# Patient Record
Sex: Male | Born: 2000 | Race: White | Hispanic: No | Marital: Single | Smoking: Never smoker
Health system: Southern US, Community
[De-identification: ages and names within clinical notes are randomized; demographics above are authoritative.]

## PROBLEM LIST (undated history)

## (undated) DIAGNOSIS — J302 Other seasonal allergic rhinitis: Secondary | ICD-10-CM

## (undated) DIAGNOSIS — S060XAA Concussion with loss of consciousness status unknown, initial encounter: Secondary | ICD-10-CM

## (undated) HISTORY — PX: MYRINGOTOMY WITH TUBE PLACEMENT: SHX5663

---
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
## 2022-04-21 ENCOUNTER — Emergency Department (HOSPITAL_COMMUNITY): Payer: No Typology Code available for payment source

## 2022-04-21 ENCOUNTER — Other Ambulatory Visit: Payer: Self-pay

## 2022-04-21 ENCOUNTER — Encounter (HOSPITAL_COMMUNITY): Payer: Self-pay

## 2022-04-21 ENCOUNTER — Emergency Department (HOSPITAL_COMMUNITY)
Admission: EM | Admit: 2022-04-21 | Discharge: 2022-04-21 | Disposition: A | Discharge status: Home / Self Care | Payer: No Typology Code available for payment source | Attending: Emergency Medicine | Admitting: Emergency Medicine

## 2022-04-21 DIAGNOSIS — S060XAA Concussion with loss of consciousness status unknown, initial encounter: Secondary | ICD-10-CM | POA: Insufficient documentation

## 2022-04-21 DIAGNOSIS — M25572 Pain in left ankle and joints of left foot: Secondary | ICD-10-CM | POA: Insufficient documentation

## 2022-04-21 DIAGNOSIS — W1789XA Other fall from one level to another, initial encounter: Secondary | ICD-10-CM | POA: Diagnosis not present

## 2022-04-21 DIAGNOSIS — M25561 Pain in right knee: Secondary | ICD-10-CM | POA: Insufficient documentation

## 2022-04-21 DIAGNOSIS — S60221A Contusion of right hand, initial encounter: Secondary | ICD-10-CM | POA: Diagnosis not present

## 2022-04-21 DIAGNOSIS — S0990XA Unspecified injury of head, initial encounter: Secondary | ICD-10-CM | POA: Diagnosis present

## 2022-04-21 DIAGNOSIS — Y990 Civilian activity done for income or pay: Secondary | ICD-10-CM | POA: Diagnosis not present

## 2022-04-21 DIAGNOSIS — S8011XA Contusion of right lower leg, initial encounter: Secondary | ICD-10-CM | POA: Insufficient documentation

## 2022-04-21 DIAGNOSIS — S8012XA Contusion of left lower leg, initial encounter: Secondary | ICD-10-CM | POA: Insufficient documentation

## 2022-04-21 HISTORY — DX: Concussion with loss of consciousness status unknown, initial encounter: S06.0XAA

## 2022-04-21 MED ORDER — MORPHINE SULFATE (PF) 2 MG/ML IV SOLN
2.0000 mg | Freq: Once | INTRAVENOUS | Status: AC
  Administered 2022-04-21: 2 mg via INTRAVENOUS
  Filled 2022-04-21: qty 1

## 2022-04-21 MED ORDER — LIDOCAINE-EPINEPHRINE (PF) 2 %-1:200000 IJ SOLN
20.0000 mL | Freq: Once | INTRAMUSCULAR | Status: AC
  Administered 2022-04-21: 20 mL
  Filled 2022-04-21: qty 20

## 2022-04-21 NOTE — ED Notes (Signed)
Suture cart placed at bedside. Lidocaine administered by MD Long, staples placed.

## 2022-04-21 NOTE — ED Provider Notes (Incomplete)
[facility] EMERGENCY DEPARTMENT Provider Note   CSN: [id number] Arrival date & time: 04/21/22  1650     History  Chief Complaint  Patient presents with   Knee Pain   Head Injury    Fernando Shah is a 21 y.o. male.  21 year old male with no pertinent past medical history presents to the ED as an unleveled trauma after he was struck by multiple falling cinderblocks approximate 1 hour ago.  Patient was not wearing a hard hat or helmet when a forklift knocked over several cinderblocks which landed on his head.  He denies losing consciousness and only complains of left ankle, right knee pain, and a headache.  Tetanus is up-to-date.  Denies neck or back pain at this time.  The history is provided by the patient and a parent.       Home Medications Prior to Admission medications   Medication Sig Start Date End Date Taking? Authorizing Provider  cetirizine (ZYRTEC) 10 MG tablet Take 10 mg by mouth daily as needed for allergies.   Yes [provider]  fluticasone (FLONASE) 50 MCG/ACT nasal spray Place 1 spray into both nostrils daily as needed for allergies.   Yes [provider]      Allergies    Augmentin [amoxicillin-pot clavulanate]    Review of Systems   Review of Systems  Cardiovascular:  Negative for chest pain.  Gastrointestinal:  Negative for abdominal pain and nausea.  Musculoskeletal:  Positive for arthralgias. Negative for back pain and neck pain.  Neurological:  Positive for headaches. Negative for weakness and numbness.    Physical Exam Updated Vital Signs BP 128/83   Pulse 84   Temp 98.6 F (37 C) (Oral)   Resp 18   Ht 5\' 11"  (1.803 m)   Wt 63.5 kg   SpO2 97%   BMI 19.53 kg/m  Physical Exam Vitals and nursing note reviewed.  Constitutional:      General: He is not in acute distress.    Appearance: Normal appearance. He is well-developed. He is obese. He is not ill-appearing.     Interventions: Cervical collar in  place.  HENT:     Head: Normocephalic.     Comments: 4 cm triangular shaped laceration present to the occipital scalp, currently hemostatic. No palpable step offs or deformities. Left periorbital ecchymosis with full active EOM without signs of entrapment. Midface stable to palpation. Eyes:     Extraocular Movements: Extraocular movements intact.     Conjunctiva/sclera: Conjunctivae normal.  Cardiovascular:     Rate and Rhythm: Normal rate and regular rhythm.     Pulses:          Radial pulses are 2+ on the right side and 2+ on the left side.       Dorsalis pedis pulses are 2+ on the right side and 2+ on the left side.     Heart sounds: No murmur heard. Pulmonary:     Effort: Pulmonary effort is normal. No respiratory distress.     Breath sounds: Normal breath sounds.  Chest:     Comments: Chest wall stable and nontender to AP and lateral compression Abdominal:     Palpations: Abdomen is soft.     Tenderness: There is no abdominal tenderness. There is no guarding.  Musculoskeletal:     Comments: No midline cervical, thoracic, or lumbar spinal tenderness to palpation.  No step-offs or deformities.  At flexion and extension at the bilateral shoulders, elbows, and wrists.  Bilateral upper extremities and lower extremities are neurovascularly intact.  No bony tenderness to palpation of the bilateral upper or lower extremities, specifically over the left malleoli or the carpals/metacarpals of the right hand.  No anatomic snuffbox tenderness.  Skin:    General: Skin is warm and dry.     Capillary Refill: Capillary refill takes less than 2 seconds.     Comments: Scattered abrasions and ecchymosis present to the bilateral lower extremities.  There is a superficial linear abrasion hematoma present to the dorsal aspect of the right hand, overlying the MCP joint of the right thumb.  Neurological:     Mental Status: He is alert.     GCS: GCS eye subscore is 4. GCS verbal subscore is 5. GCS motor  subscore is 6.     Comments: Moving all 4 extremities spontaneously     ED Results / Procedures / Treatments   Labs (all labs ordered are listed, but only abnormal results are displayed) Labs Reviewed - No data to display  EKG None  Radiology DG Ankle Complete Left  Result Date: 04/21/2022 CLINICAL DATA:  Left ankle pain. EXAM: LEFT ANKLE COMPLETE - 3+ VIEW COMPARISON:  None Available. FINDINGS: There is a tiny bone fragment adjacent to the tip of the medial malleolus suspicious for a small avulsion fracture. No other acute fracture. There is no dislocation. The bones are well mineralized. No arthritic changes. There is minimal soft tissue swelling over the medial ankle. No radiopaque foreign object or soft tissue gas. IMPRESSION: Age indeterminate, likely acute, tiny avulsion fracture of the tip of the medial malleolus. Correlation with clinical exam and point tenderness recommended. Electronically Signed   By: Elgie Collard M.D.   On: 04/21/2022 20:00   CT Head Wo Contrast  Result Date: 04/21/2022 CLINICAL DATA:  Concrete pallets fell approximally 12 feet on top of the patient. EXAM: CT HEAD WITHOUT CONTRAST CT CERVICAL SPINE WITHOUT CONTRAST TECHNIQUE: Multidetector CT imaging of the head and cervical spine was performed following the standard protocol without intravenous contrast. Multiplanar CT image reconstructions of the cervical spine were also generated. RADIATION DOSE REDUCTION: This exam was performed according to the departmental dose-optimization program which includes automated exposure control, adjustment of the mA and/or kV according to patient size and/or use of iterative reconstruction technique. COMPARISON:  CT head and cervical spine dated February 18, 2016. FINDINGS: CT HEAD FINDINGS Brain: No evidence of acute infarction, hemorrhage, hydrocephalus, extra-axial collection or mass lesion/mass effect. Vascular: No hyperdense vessel or unexpected calcification. Skull: Normal.  Negative for fracture or focal lesion. Sinuses/Orbits: No acute finding. Other: None. CT CERVICAL SPINE FINDINGS Alignment: Straightening of the normal cervical lordosis. No traumatic malalignment. Skull base and vertebrae: No acute fracture. No primary bone lesion or focal pathologic process. Soft tissues and spinal canal: No prevertebral fluid or swelling. No visible canal hematoma. Disc levels:  Normal. Upper chest: Negative. Other: None. IMPRESSION: 1. No acute intracranial abnormality. 2. No acute cervical spine fracture or traumatic listhesis. Electronically Signed   By: Obie Dredge M.D.   On: 04/21/2022 18:52   CT Cervical Spine Wo Contrast  Result Date: 04/21/2022 CLINICAL DATA:  Concrete pallets fell approximally 12 feet on top of the patient. EXAM: CT HEAD WITHOUT CONTRAST CT CERVICAL SPINE WITHOUT CONTRAST TECHNIQUE: Multidetector CT imaging of the head and cervical spine was performed following the standard protocol without intravenous contrast. Multiplanar CT image reconstructions of the cervical spine were also generated. RADIATION DOSE REDUCTION: This exam was performed  according to the departmental dose-optimization program which includes automated exposure control, adjustment of the mA and/or kV according to patient size and/or use of iterative reconstruction technique. COMPARISON:  CT head and cervical spine dated February 18, 2016. FINDINGS: CT HEAD FINDINGS Brain: No evidence of acute infarction, hemorrhage, hydrocephalus, extra-axial collection or mass lesion/mass effect. Vascular: No hyperdense vessel or unexpected calcification. Skull: Normal. Negative for fracture or focal lesion. Sinuses/Orbits: No acute finding. Other: None. CT CERVICAL SPINE FINDINGS Alignment: Straightening of the normal cervical lordosis. No traumatic malalignment. Skull base and vertebrae: No acute fracture. No primary bone lesion or focal pathologic process. Soft tissues and spinal canal: No prevertebral fluid or  swelling. No visible canal hematoma. Disc levels:  Normal. Upper chest: Negative. Other: None. IMPRESSION: 1. No acute intracranial abnormality. 2. No acute cervical spine fracture or traumatic listhesis. Electronically Signed   By: Obie Dredge M.D.   On: 04/21/2022 18:52    Procedures Procedures    Medications Ordered in ED Medications  morphine (PF) 2 MG/ML injection 2 mg (2 mg Intravenous Given 04/21/22 1857)  lidocaine-EPINEPHrine (XYLOCAINE W/EPI) 2 %-1:200000 (PF) injection 20 mL (20 mLs Infiltration Given by Other 04/21/22 1938)    ED Course/ Medical Decision Making/ A&P                           Medical Decision Making Amount and/or Complexity of Data Reviewed Independent Historian: parent Radiology: ordered.  Risk Prescription drug management. Parenteral controlled substances.   21 year old previously healthy male presents the ED after multiple cinderblocks fell on his head.  On arrival, patient is hemodynamically stable, GCS of 15, moving all 4 extremity spontaneously.  Initial concern for underlying skull fractures with possible ICH. Considered cervical spine fractures due to the axial load of the injury, though he his spine is non-tender on exam.   Final Clinical Impression(s) / ED Diagnoses Final diagnoses:  Injury of head, initial encounter  Concussion with unknown loss of consciousness status, initial encounter    Rx / DC Orders ED Discharge Orders     None

## 2022-04-21 NOTE — ED Triage Notes (Signed)
Arrived via EMS, Pt underneath concrete pallets that fell approximately 10-14 feet on top of his body. No LOC, No Dizziness denies being on any blood thinner. C/O left ankle pain and right ankle pain , multiple abrasions to extremities with lacerations to occipital area.

## 2022-04-21 NOTE — ED Notes (Signed)
Mr. Hair return from CT. Denies the need for pain medications. States just sleepy and wants to go to bed at this time.

## 2022-04-21 NOTE — Discharge Instructions (Signed)
Your CT scans and xrays did not show any injuries. You likely have a concussion. Please limit screen time and ensure adequate hydration and good sleep hygiene. If you develop any worsening headache, numbness or weakness in your arms and legs, please return to the emergency room. Staples will need to be removed in 7 days. Thank you for letting us care for you.

## 2023-04-11 IMAGING — CT CT HEAD W/O CM
4 series · 16 of 47 positions shown, 18 images · non-contrast
Comparison: CT head and cervical spine dated February 18, 2016.

CLINICAL DATA: Concrete pallets fell approximally 12 feet on top of
the patient.



[Series 3: head without · axial · non-contrast · 0.43mm/px · z∈[-180,-60]mm · 7 of 33 slices shown, 9 images]
[im 5/33  brain]
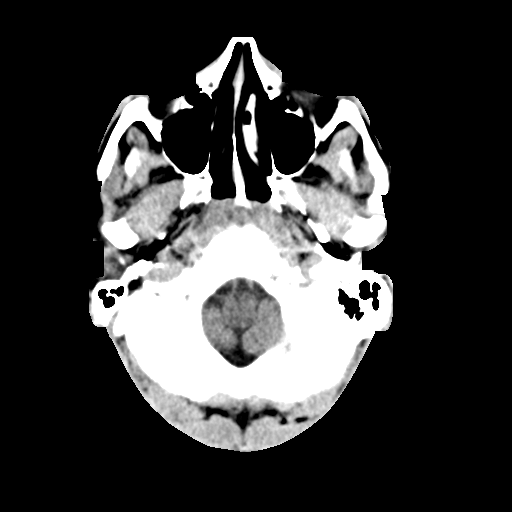
[im 5/33  bone]
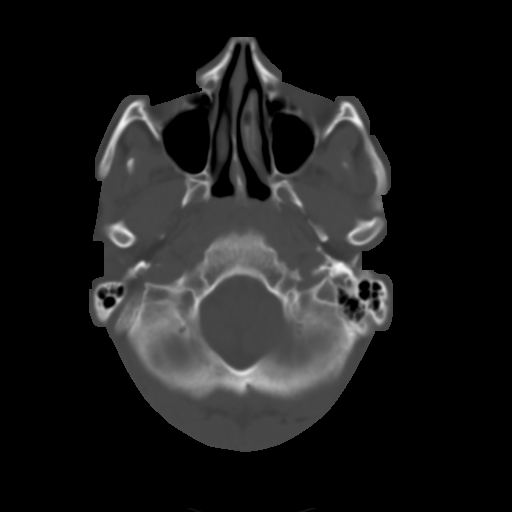
[im 9/33  brain]
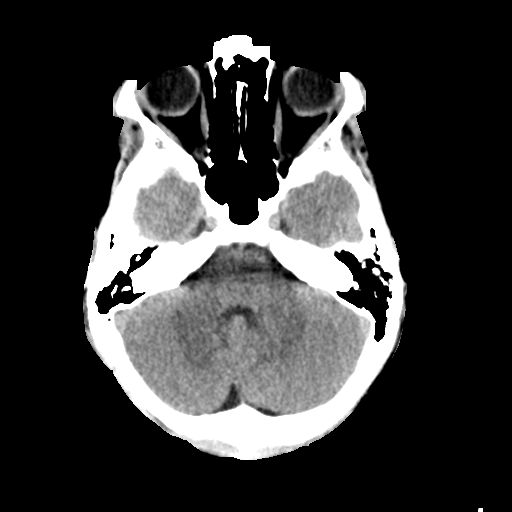
[im 13/33  brain]
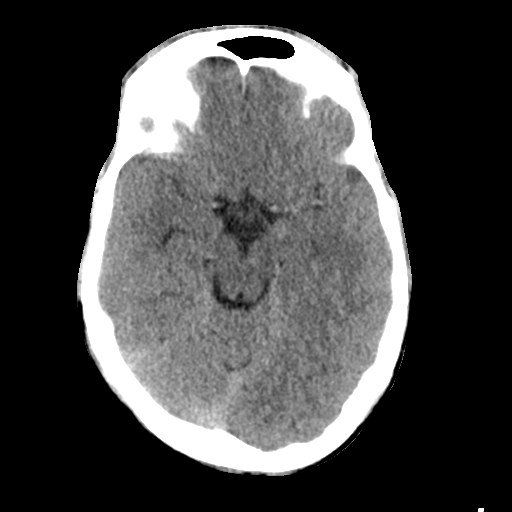
[im 17/33  brain]
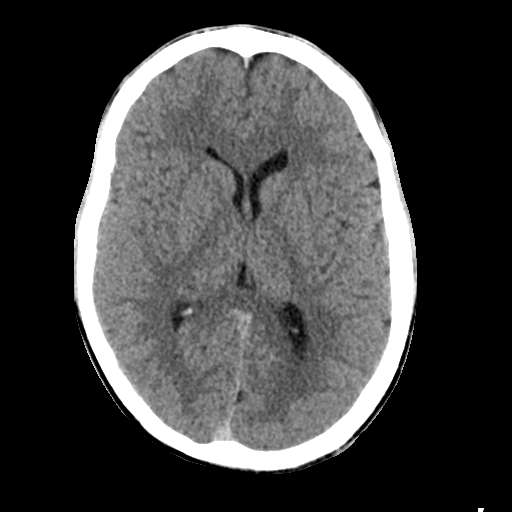
[im 21/33  brain]
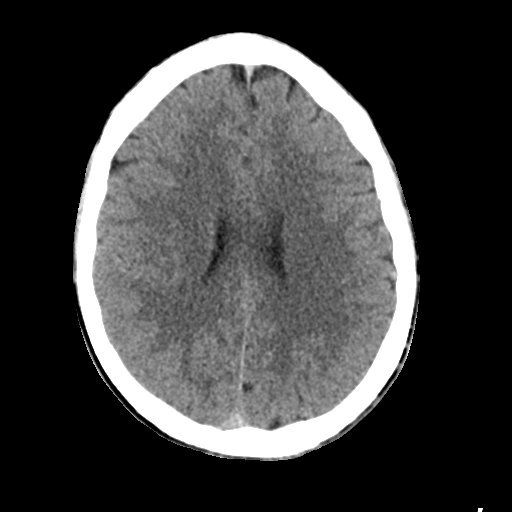
[im 21/33  bone]
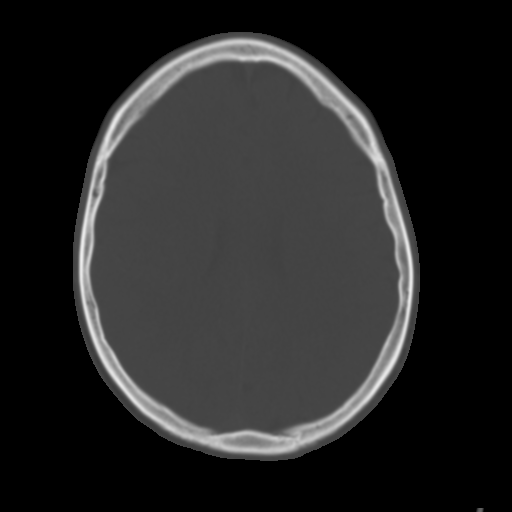
[im 25/33  brain]
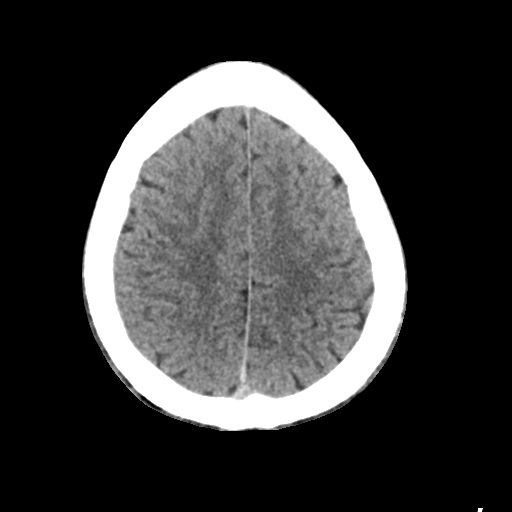
[im 29/33  brain]
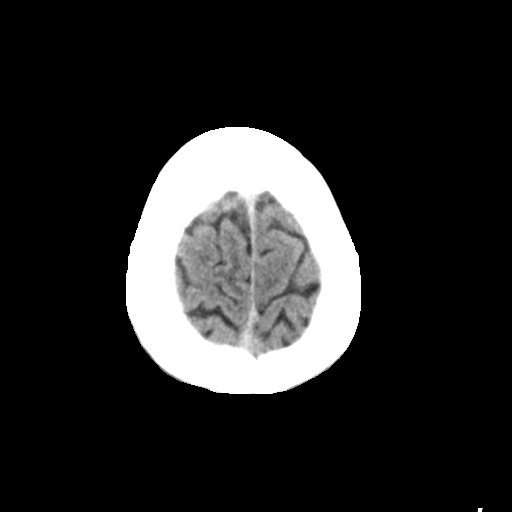

[Series 4: head bone · axial · 0.43mm/px · z∈[-184,-152]mm · 3 of 83 slices shown]
[im 9/83  bone]
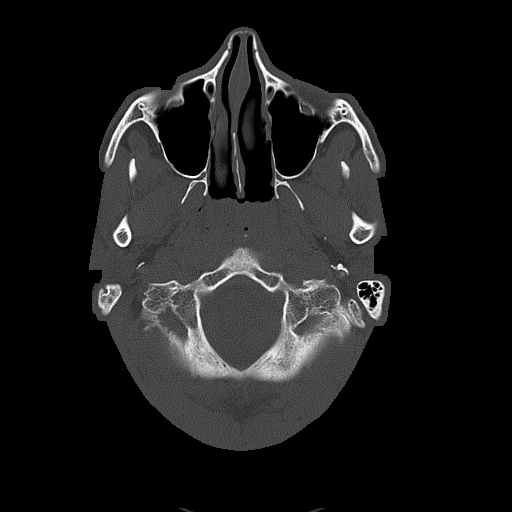
[im 17/83  bone]
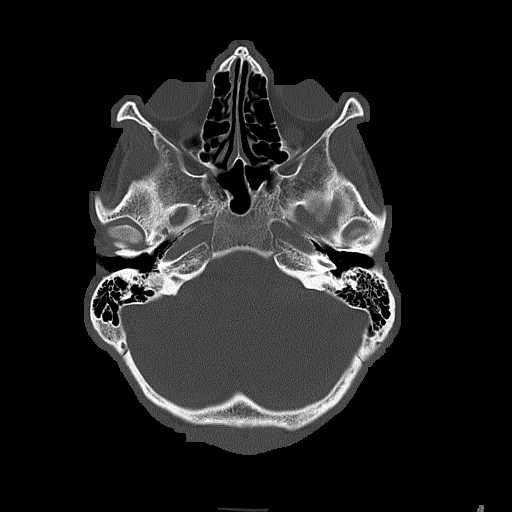
[im 25/83  bone]
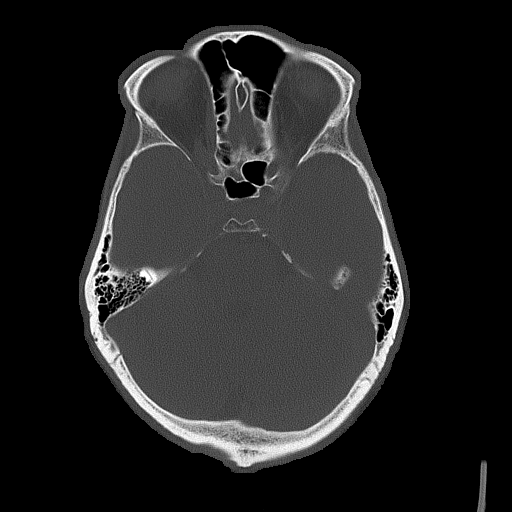

[Series 5: head without cor · coronal · non-contrast · 0.31mm/px · 3 of 71 slices shown]
[im 24/71  brain]
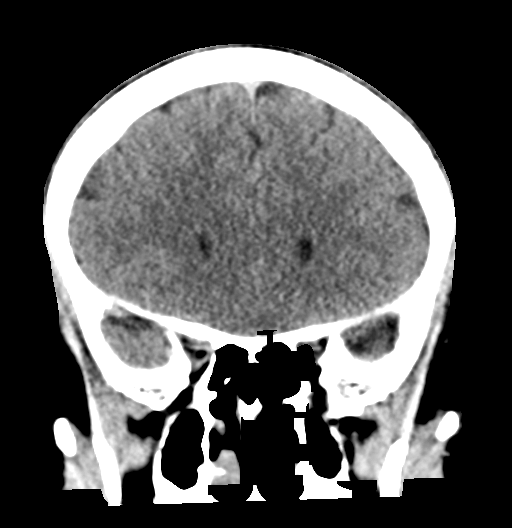
[im 32/71  brain]
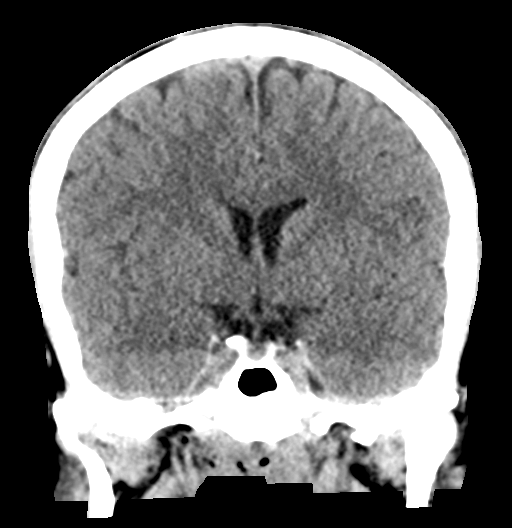
[im 39/71  brain]
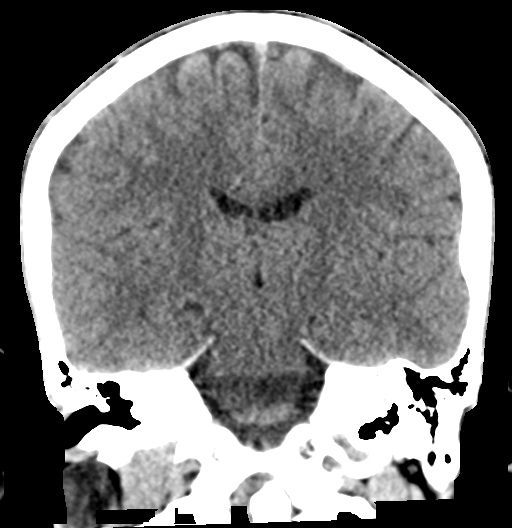

[Series 6: head without sag · sagittal · non-contrast · 0.32mm/px · 3 of 55 slices shown]
[im 19/55  brain]
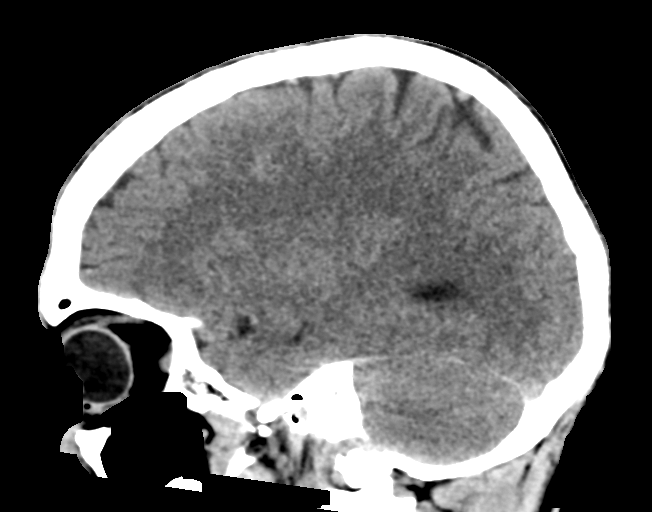
[im 28/55  brain]
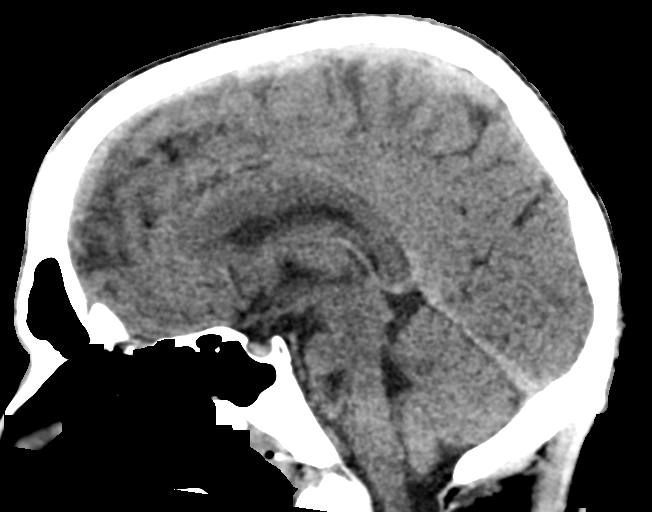
[im 37/55  brain]
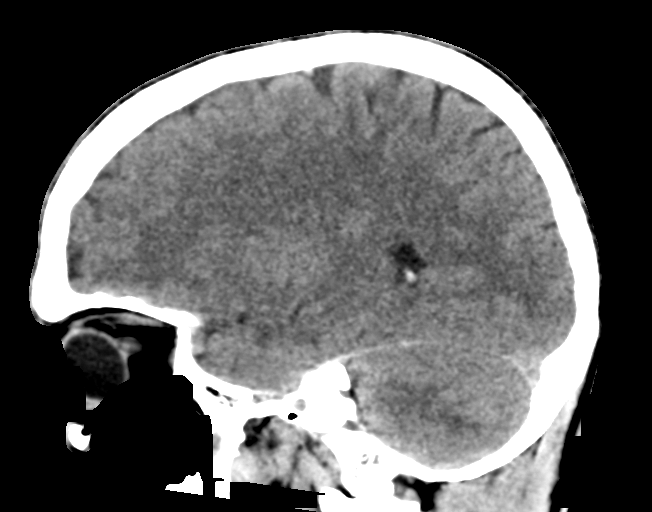

[16 of 47 positions shown; findings below may reference images not displayed]

FINDINGS: CT HEAD FINDINGS

Brain: No evidence of acute infarction, hemorrhage, hydrocephalus,
extra-axial collection or mass lesion/mass effect.

Vascular: No hyperdense vessel or unexpected calcification.

Skull: Normal. Negative for fracture or focal lesion.

Sinuses/Orbits: No acute finding.

Other: None.

CT CERVICAL SPINE FINDINGS

Alignment: Straightening of the normal cervical lordosis. No
traumatic malalignment.

Skull base and vertebrae: No acute fracture. No primary bone lesion
or focal pathologic process.

Soft tissues and spinal canal: No prevertebral fluid or swelling. No
visible canal hematoma.

Disc levels:  Normal.

Upper chest: Negative.

Other: None.
IMPRESSION: 1. No acute intracranial abnormality.
2. No acute cervical spine fracture or traumatic listhesis.

## 2023-04-11 IMAGING — CT CT CERVICAL SPINE W/O CM
3 of 4 series · 9 of 33 positions shown, 11 images · non-contrast
Comparison: CT head and cervical spine dated February 18, 2016.

CLINICAL DATA: Concrete pallets fell approximally 12 feet on top of
the patient.



[Series 4: c_spine 2.0 st · axial · 0.39mm/px · z∈[-260,-260]mm · 1 of 91 slices shown, 2 images]
[im 46/91  soft-tissue]
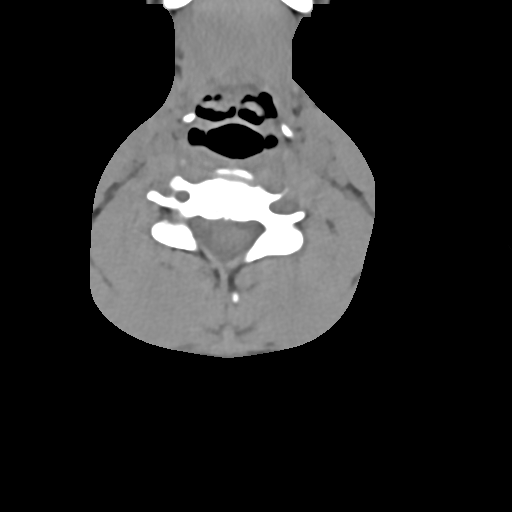
[im 46/91  bone]
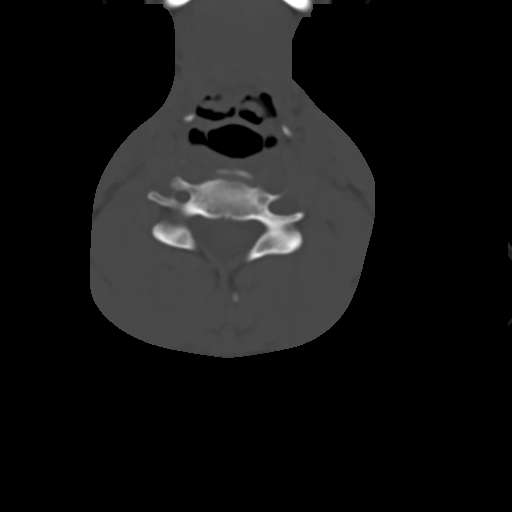

[Series 9: c_spine 2.0 sag bone · sagittal · 0.21mm/px · 5 of 61 slices shown, 6 images]
[im 21/61  bone]
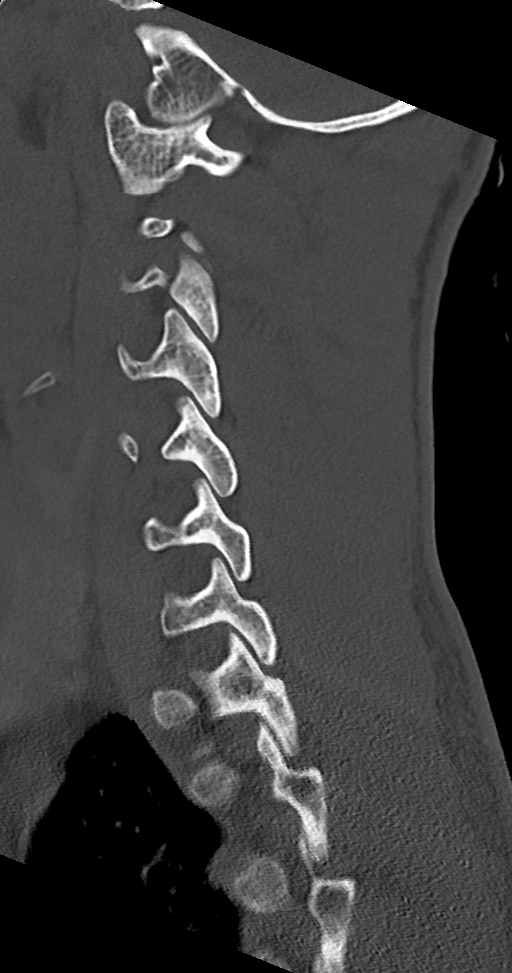
[im 26/61  bone]
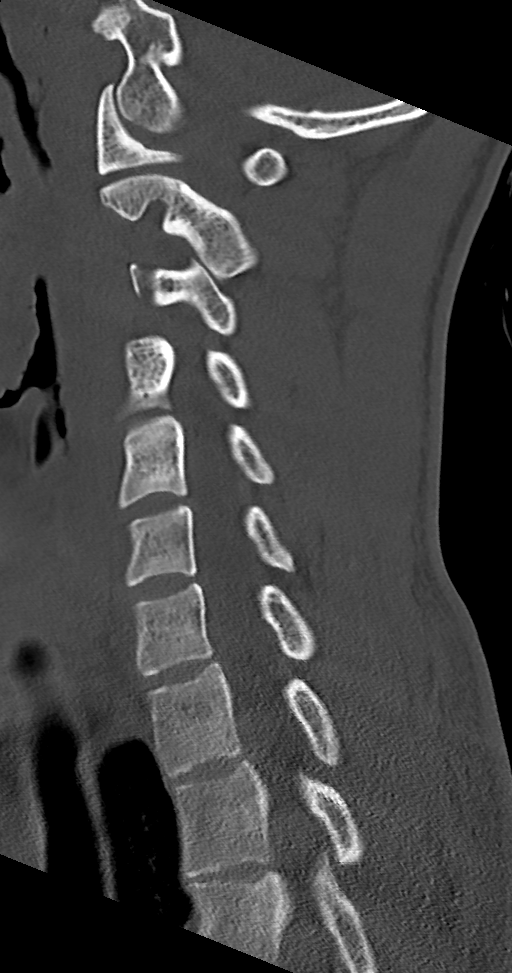
[im 31/61  soft-tissue]
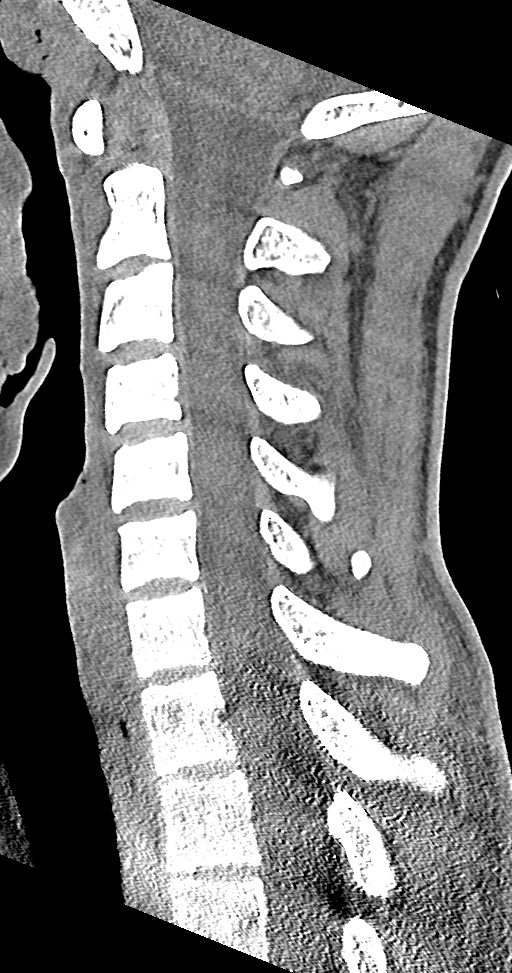
[im 31/61  bone]
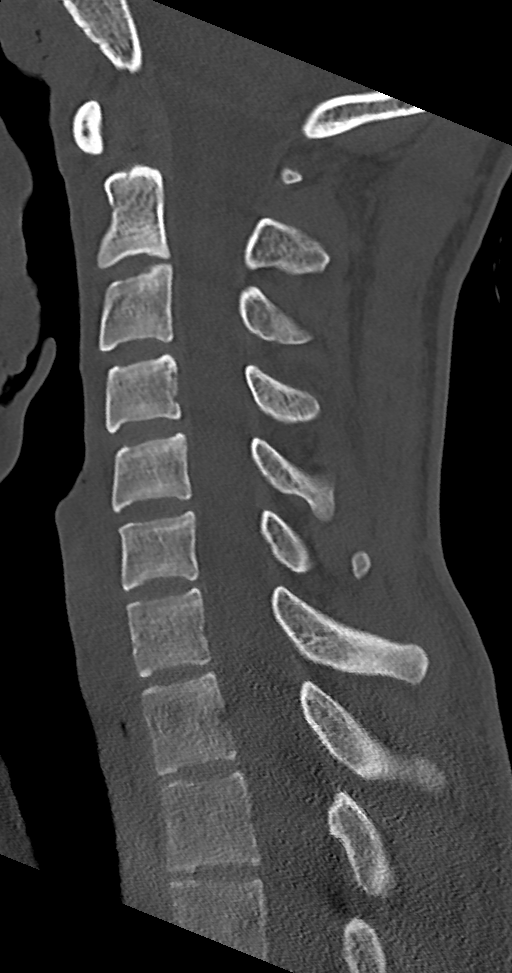
[im 36/61  bone]
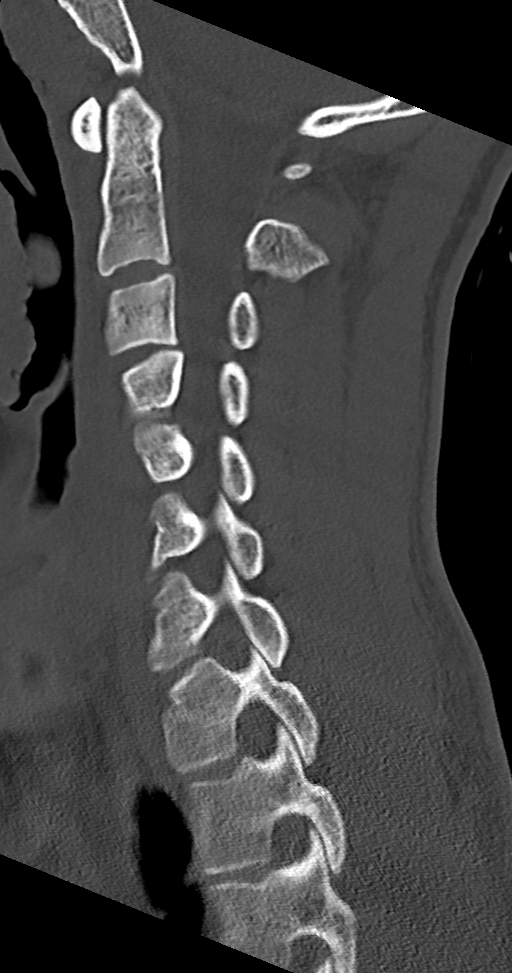
[im 41/61  bone]
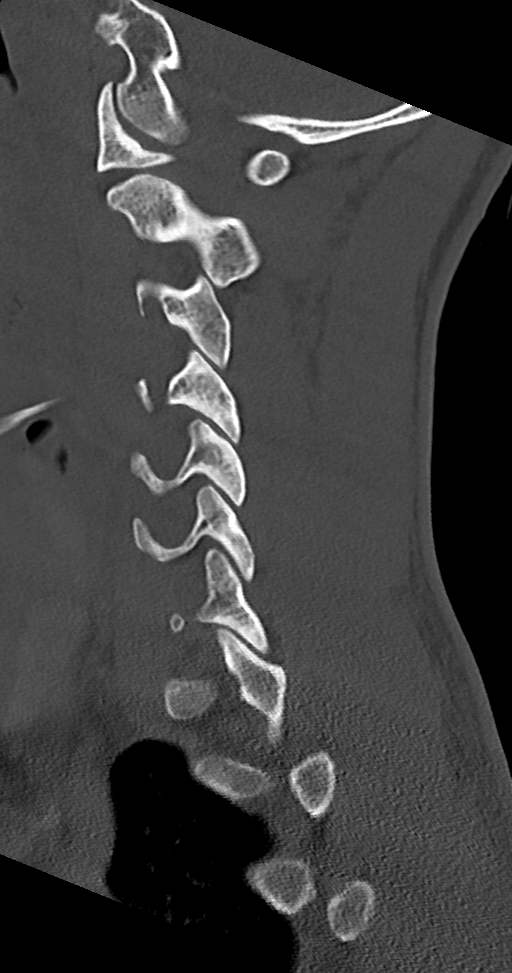

[Series 10: c_spine 2.0 cor bone · coronal · 0.22mm/px · 3 of 61 slices shown]
[im 13/61  bone]
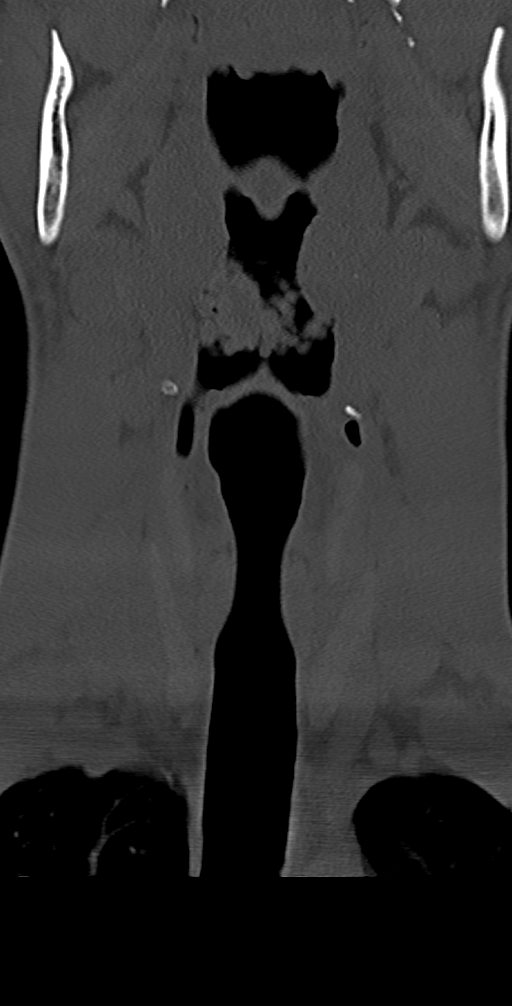
[im 25/61  bone]
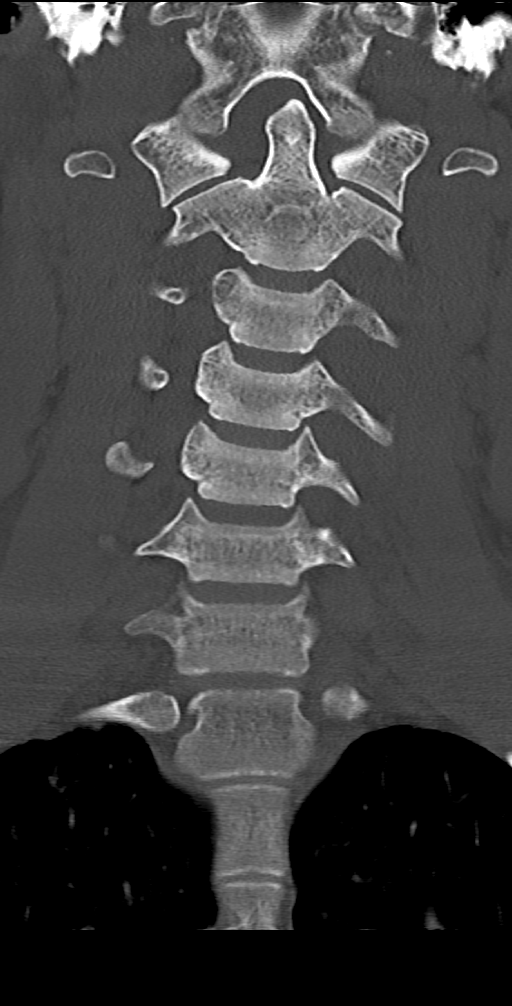
[im 36/61  bone]
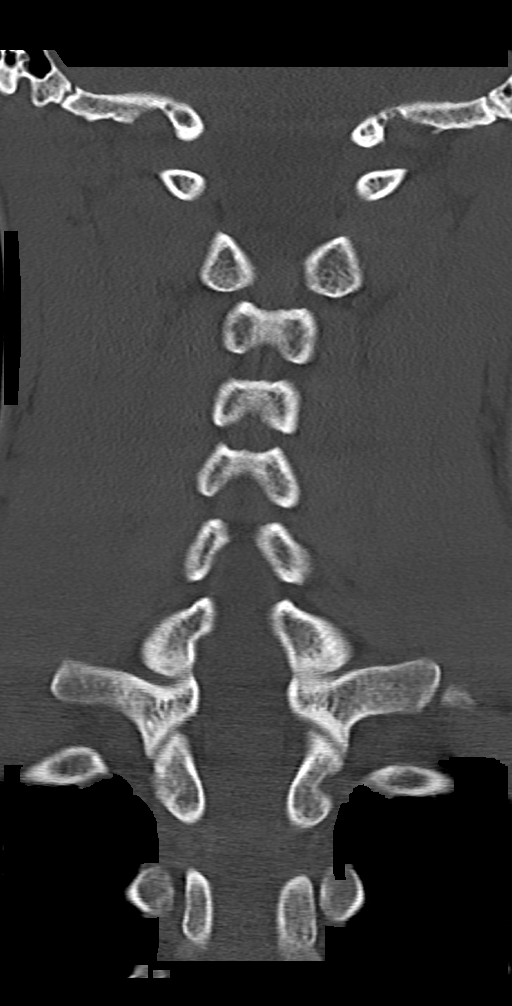

[9 of 33 positions shown; findings below may reference images not displayed]

FINDINGS: CT HEAD FINDINGS

Brain: No evidence of acute infarction, hemorrhage, hydrocephalus,
extra-axial collection or mass lesion/mass effect.

Vascular: No hyperdense vessel or unexpected calcification.

Skull: Normal. Negative for fracture or focal lesion.

Sinuses/Orbits: No acute finding.

Other: None.

CT CERVICAL SPINE FINDINGS

Alignment: Straightening of the normal cervical lordosis. No
traumatic malalignment.

Skull base and vertebrae: No acute fracture. No primary bone lesion
or focal pathologic process.

Soft tissues and spinal canal: No prevertebral fluid or swelling. No
visible canal hematoma.

Disc levels:  Normal.

Upper chest: Negative.

Other: None.
IMPRESSION: 1. No acute intracranial abnormality.
2. No acute cervical spine fracture or traumatic listhesis.
